# Patient Record
Sex: Female | Born: 2000 | Race: White | Hispanic: No | Marital: Single | Smoking: Never smoker
Health system: Southern US, Community
[De-identification: ages and names within clinical notes are randomized; demographics above are authoritative.]

---
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
## 2024-01-11 ENCOUNTER — Ambulatory Visit: Admission: EM | Admit: 2024-01-11 | Discharge: 2024-01-11 | Disposition: A | Discharge status: Home / Self Care

## 2024-01-11 DIAGNOSIS — H6692 Otitis media, unspecified, left ear: Secondary | ICD-10-CM | POA: Diagnosis not present

## 2024-01-11 MED ORDER — AMOXICILLIN 875 MG PO TABS: 875.0000 mg | ORAL_TABLET | Freq: Two times a day (BID) | ORAL | 0 refills | Status: AC

## 2024-01-11 NOTE — ED Triage Notes (Signed)
 Patient to Urgent Care with complaints of left sided ear pain and "whooshing" sound that started three days ago.  Taking ibuprofen/ allergy meds/ otc ear drops.

## 2024-01-11 NOTE — Discharge Instructions (Addendum)
 Take the amoxicillin as directed.  Follow up with your primary care provider if your symptoms are not improving.

## 2024-01-11 NOTE — ED Provider Notes (Signed)
 Arlander Bellman    CSN: [id number] Arrival date & time: 01/11/24  9528      History   Chief Complaint Chief Complaint  Patient presents with   Otalgia    HPI Shalette Deeken is a 23 y.o. female.  Patient presents with 3-day history of left ear pain.  No fever, ear drainage, sore throat, cough, shortness of breath.  No OTC medication taken today but previously treating with ibuprofen and allergy medication.    The history is provided by the patient and medical records.    History reviewed. No pertinent past medical history.  There are no active problems to display for this patient.   History reviewed. No pertinent surgical history.  OB History   No obstetric history on file.      Home Medications    Prior to Admission medications   Medication Sig Start Date End Date Taking? Authorizing Provider  amoxicillin (AMOXIL) 875 MG tablet Take 1 tablet (875 mg total) by mouth 2 (two) times daily for 10 days. 01/11/24 01/21/24 Yes Wellington Half, NP  LORYNA 3-0.02 MG tablet Take 1 tablet by mouth daily. 12/23/23  Yes [provider]    Family History History reviewed. No pertinent family history.  Social History Social History   Tobacco Use   Smoking status: Never   Smokeless tobacco: Never  Vaping Use   Vaping status: Never Used  Substance Use Topics   Alcohol use: Never   Drug use: Never     Allergies   Sulfa antibiotics   Review of Systems Review of Systems  Constitutional:  Negative for chills and fever.  HENT:  Positive for ear pain. Negative for ear discharge and sore throat.   Respiratory:  Negative for cough and shortness of breath.      Physical Exam Triage Vital Signs ED Triage Vitals  Encounter Vitals Group     BP 01/11/24 0929 132/76     Systolic BP Percentile --      Diastolic BP Percentile --      Pulse Rate 01/11/24 0929 96     Resp 01/11/24 0929 18     Temp 01/11/24 0929 98 F (36.7 C)     Temp src --      SpO2  01/11/24 0929 96 %     Weight --      Height --      Head Circumference --      Peak Flow --      Pain Score 01/11/24 0928 2     Pain Loc --      Pain Education --      Exclude from Growth Chart --    No data found.  Updated Vital Signs BP 132/76   Pulse 96   Temp 98 F (36.7 C)   Resp 18   SpO2 96%   Visual Acuity Right Eye Distance:   Left Eye Distance:   Bilateral Distance:    Right Eye Near:   Left Eye Near:    Bilateral Near:     Physical Exam Constitutional:      General: She is not in acute distress. HENT:     Right Ear: Tympanic membrane and ear canal normal.     Left Ear: Ear canal normal. Tympanic membrane is erythematous.     Nose: Nose normal.     Mouth/Throat:     Mouth: Mucous membranes are moist.     Pharynx: Oropharynx is clear.  Cardiovascular:  Rate and Rhythm: Normal rate and regular rhythm.     Heart sounds: Normal heart sounds.  Pulmonary:     Effort: Pulmonary effort is normal. No respiratory distress.     Breath sounds: Normal breath sounds.  Neurological:     Mental Status: She is alert.      UC Treatments / Results  Labs (all labs ordered are listed, but only abnormal results are displayed) Labs Reviewed - No data to display  EKG   Radiology No results found.  Procedures Procedures (including critical care time)  Medications Ordered in UC Medications - No data to display  Initial Impression / Assessment and Plan / UC Course  I have reviewed the triage vital signs and the nursing notes.  Pertinent labs & imaging results that were available during my care of the patient were reviewed by me and considered in my medical decision making (see chart for details).   Left otitis media.  Afebrile and vital signs are stable.  Treating with amoxicillin.  Tylenol or ibuprofen as needed.  Education provided on otitis media.  Instructed patient to follow up with her PCP if her symptoms are not improving.  She agrees to plan of  care.   Final Clinical Impressions(s) / UC Diagnoses   Final diagnoses:  Left otitis media, unspecified otitis media type     Discharge Instructions      Take the amoxicillin as directed.  Follow-up with your primary care provider if your symptoms are not improving.    ED Prescriptions     Medication Sig Dispense Auth. Provider   amoxicillin (AMOXIL) 875 MG tablet Take 1 tablet (875 mg total) by mouth 2 (two) times daily for 10 days. 20 tablet Wellington Half, NP      PDMP not reviewed this encounter.   Wellington Half, NP 01/11/24 [phone]
